# Patient Record
Sex: Female | Born: 1955 | Hispanic: No | Marital: Single | Smoking: Never smoker
Health system: Southern US, Community
[De-identification: ages and names within clinical notes are randomized; demographics above are authoritative.]

---

## 2011-12-18 ENCOUNTER — Emergency Department (HOSPITAL_COMMUNITY)
Admission: EM | Admit: 2011-12-18 | Discharge: 2011-12-18 | Disposition: A | Discharge status: Home / Self Care | Payer: No Typology Code available for payment source | Attending: Emergency Medicine | Admitting: Emergency Medicine

## 2011-12-18 ENCOUNTER — Emergency Department (HOSPITAL_COMMUNITY): Payer: No Typology Code available for payment source

## 2011-12-18 ENCOUNTER — Encounter (HOSPITAL_COMMUNITY): Payer: Self-pay | Admitting: *Deleted

## 2011-12-18 DIAGNOSIS — R51 Headache: Secondary | ICD-10-CM

## 2011-12-18 DIAGNOSIS — Y92410 Unspecified street and highway as the place of occurrence of the external cause: Secondary | ICD-10-CM | POA: Insufficient documentation

## 2011-12-18 DIAGNOSIS — M7918 Myalgia, other site: Secondary | ICD-10-CM

## 2011-12-18 DIAGNOSIS — M25539 Pain in unspecified wrist: Secondary | ICD-10-CM | POA: Insufficient documentation

## 2011-12-18 DIAGNOSIS — Y939 Activity, unspecified: Secondary | ICD-10-CM | POA: Insufficient documentation

## 2011-12-18 MED ORDER — OXYCODONE-ACETAMINOPHEN 5-325 MG PO TABS
1.0000 | ORAL_TABLET | Freq: Once | ORAL | Status: AC
  Administered 2011-12-18: 1 via ORAL

## 2011-12-18 MED ORDER — OXYCODONE-ACETAMINOPHEN 5-325 MG PO TABS
ORAL_TABLET | ORAL | Status: AC
  Administered 2011-12-18: 1 via ORAL
  Filled 2011-12-18: qty 1

## 2011-12-18 MED ORDER — OXYCODONE-ACETAMINOPHEN 5-325 MG PO TABS
ORAL_TABLET | ORAL | Status: AC
  Filled 2011-12-18: qty 1

## 2011-12-18 MED ORDER — OXYCODONE-ACETAMINOPHEN 5-325 MG PO TABS: ORAL_TABLET | ORAL | Status: AC
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
## 2011-12-18 NOTE — ED Provider Notes (Signed)
Medical screening examination/treatment/procedure(s) were performed by non-physician practitioner and as supervising physician I was immediately available for consultation/collaboration.  Doug Sou, MD 12/18/11 1649
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
## 2011-12-18 NOTE — ED Notes (Signed)
MD at bedside. 
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
## 2013-10-10 ENCOUNTER — Encounter (HOSPITAL_COMMUNITY): Payer: Self-pay | Admitting: Emergency Medicine

## 2013-10-10 ENCOUNTER — Emergency Department (INDEPENDENT_AMBULATORY_CARE_PROVIDER_SITE_OTHER)
Admission: EM | Admit: 2013-10-10 | Discharge: 2013-10-10 | Disposition: A | Discharge status: Home / Self Care | Payer: Worker's Compensation | Source: Home / Self Care | Attending: Family Medicine | Admitting: Family Medicine

## 2013-10-10 DIAGNOSIS — S61412A Laceration without foreign body of left hand, initial encounter: Secondary | ICD-10-CM

## 2013-10-10 DIAGNOSIS — Y9229 Other specified public building as the place of occurrence of the external cause: Secondary | ICD-10-CM

## 2013-10-10 DIAGNOSIS — S61409A Unspecified open wound of unspecified hand, initial encounter: Secondary | ICD-10-CM

## 2013-10-10 DIAGNOSIS — W268XXA Contact with other sharp object(s), not elsewhere classified, initial encounter: Secondary | ICD-10-CM

## 2013-10-10 MED ORDER — TETANUS-DIPHTH-ACELL PERTUSSIS 5-2.5-18.5 LF-MCG/0.5 IM SUSP
0.5000 mL | Freq: Once | INTRAMUSCULAR | Status: AC
  Administered 2013-10-10: 0.5 mL via INTRAMUSCULAR

## 2013-10-10 MED ORDER — TETANUS-DIPHTH-ACELL PERTUSSIS 5-2.5-18.5 LF-MCG/0.5 IM SUSP
INTRAMUSCULAR | Status: AC
  Filled 2013-10-10: qty 0.5

## 2013-10-10 MED ORDER — NEOMYCIN-POLYMYXIN-HC 3.5-10000-1 OT SUSP
OTIC | Status: AC
  Filled 2013-10-10: qty 10

## 2013-10-10 MED ORDER — BACITRACIN ZINC 500 UNIT/GM EX OINT
TOPICAL_OINTMENT | CUTANEOUS | Status: AC
  Filled 2013-10-10: qty 0.9

## 2013-10-10 NOTE — Discharge Instructions (Signed)
You had a tetanus booster, ok to work as scheduled, return as needed.
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
## 2014-03-15 IMAGING — CR DG WRIST COMPLETE 3+V*L*
4 series · 4 of 4 positions shown · non-contrast
Comparison: None.

CLINICAL DATA: Left wrist pain, post fall

LEFT WRIST - COMPLETE 3+ VIEW

[x wrist pa left]
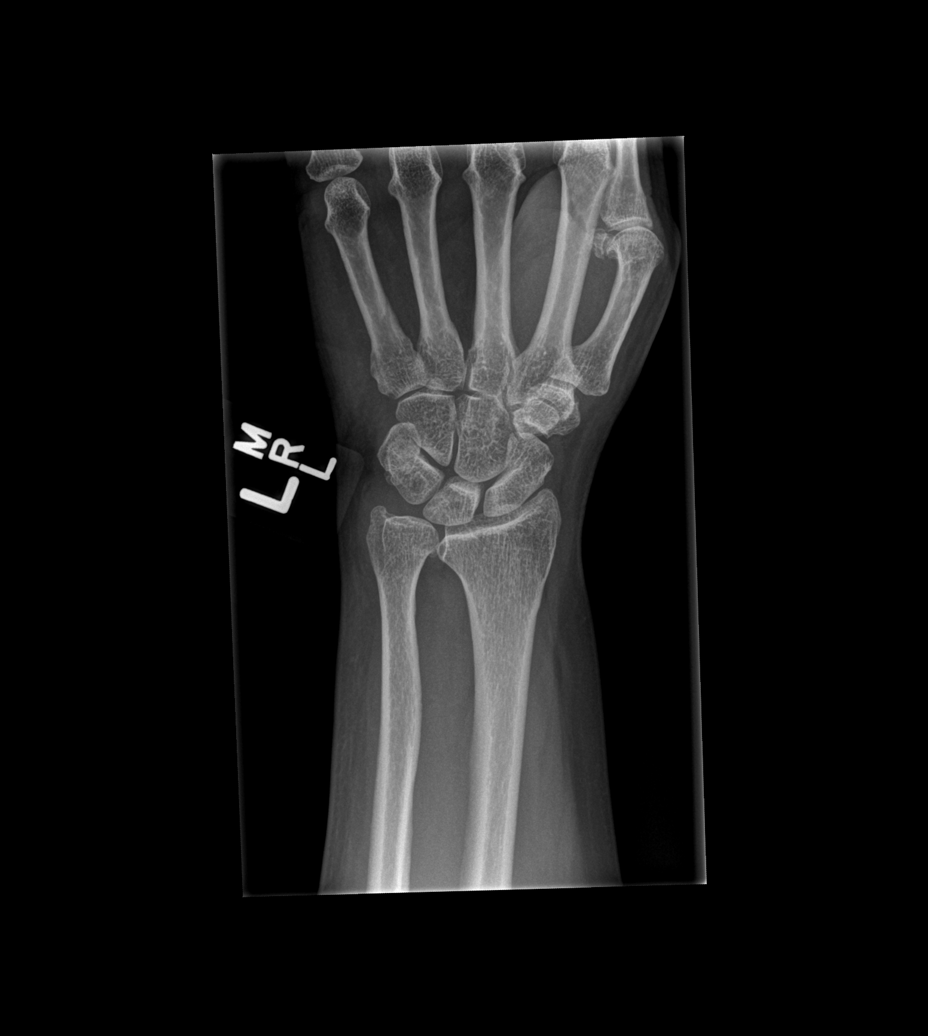

[x wrist obl left]
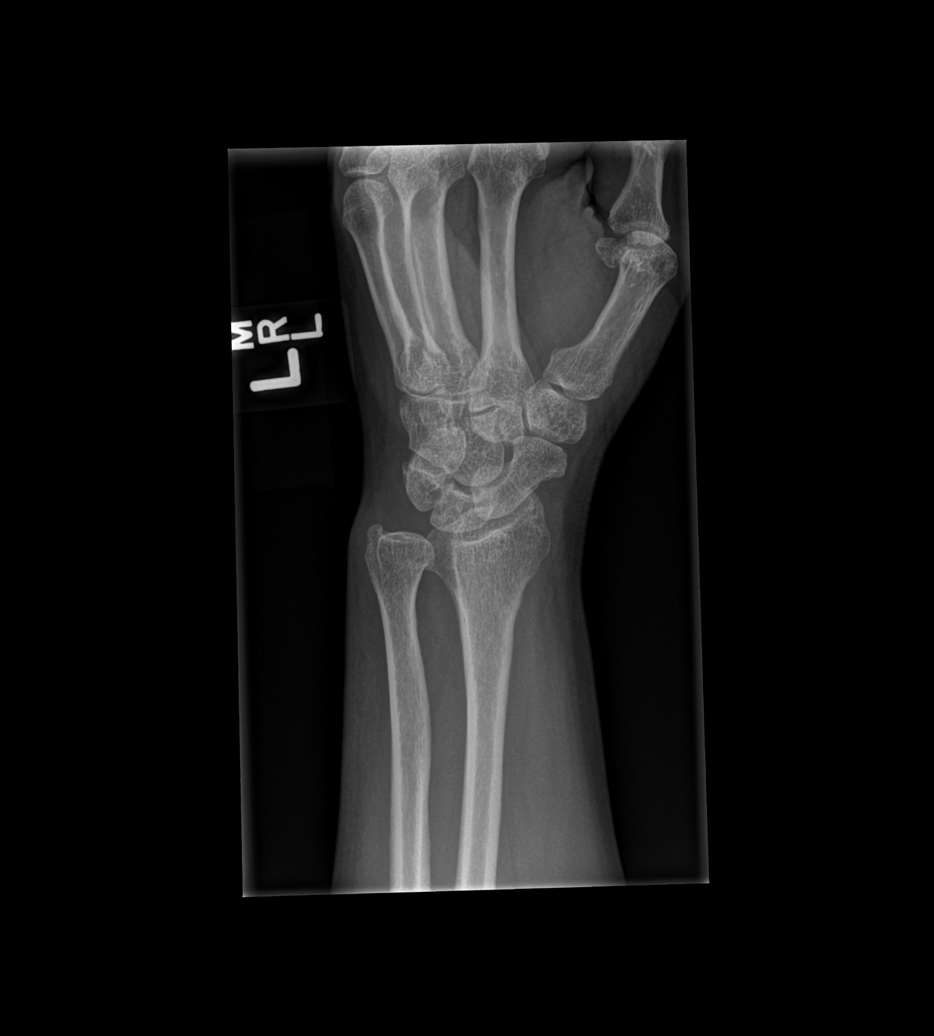

[x wrist lat left]
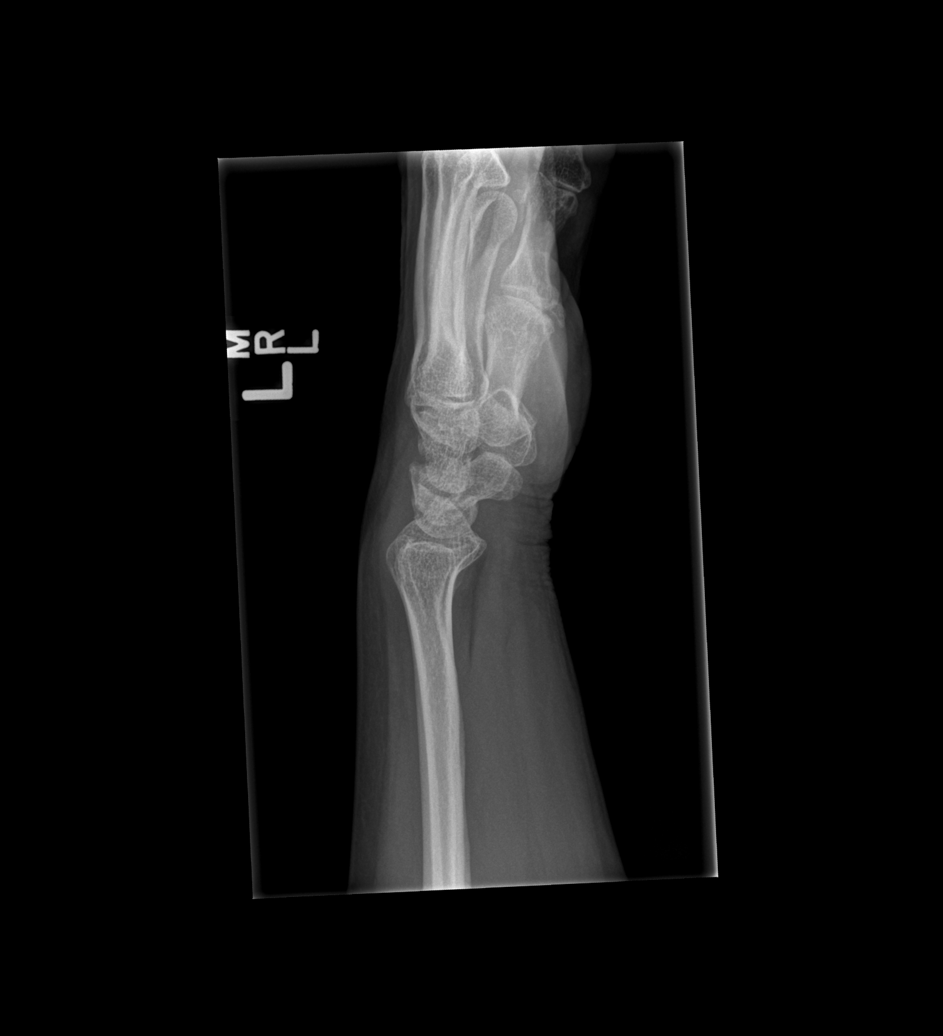

[x wrist navicular view left]
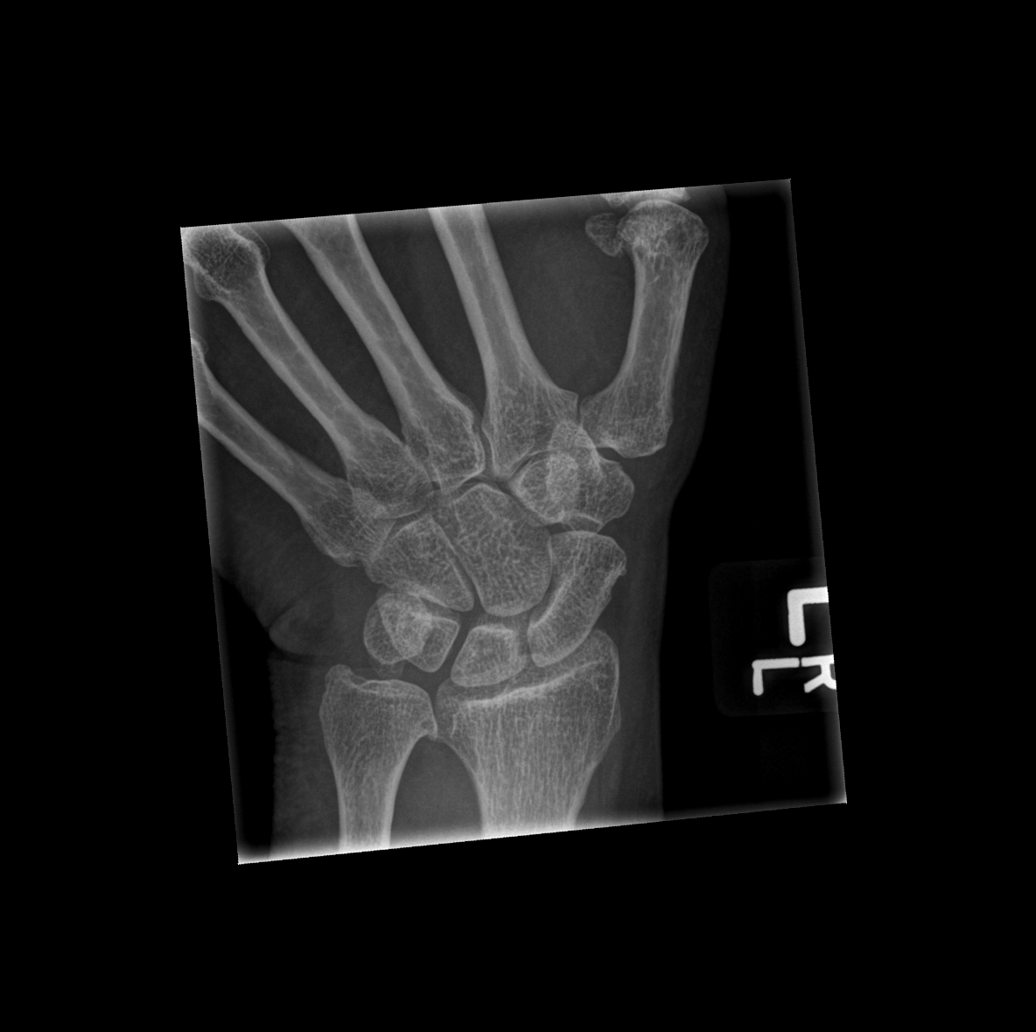

[4 of 4 positions shown; findings below may reference images not displayed]

FINDINGS: Four views of the left wrist submitted.  No acute
fracture or subluxation.  No radiopaque foreign body.
IMPRESSION: No acute fracture or subluxation.
